# Patient Record
Sex: Male | Born: 2005 | Race: Black or African American | Hispanic: No | Marital: Single | Smoking: Never smoker
Health system: Southern US, Community
[De-identification: ages and names within clinical notes are randomized; demographics above are authoritative.]

---
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
## 2023-01-08 ENCOUNTER — Ambulatory Visit (HOSPITAL_COMMUNITY)
Admission: EM | Admit: 2023-01-08 | Discharge: 2023-01-08 | Disposition: A | Discharge status: Home / Self Care | Payer: Medicaid Other | Attending: Emergency Medicine | Admitting: Emergency Medicine

## 2023-01-08 ENCOUNTER — Encounter (HOSPITAL_COMMUNITY): Payer: Self-pay | Admitting: *Deleted

## 2023-01-08 ENCOUNTER — Ambulatory Visit (HOSPITAL_COMMUNITY): Payer: Self-pay

## 2023-01-08 DIAGNOSIS — S29011A Strain of muscle and tendon of front wall of thorax, initial encounter: Secondary | ICD-10-CM

## 2023-01-08 MED ORDER — IBUPROFEN 800 MG PO TABS: 800.0000 mg | ORAL_TABLET | Freq: Three times a day (TID) | ORAL | 0 refills | Status: AC | PRN

## 2023-01-08 MED ORDER — IBUPROFEN 800 MG PO TABS
800.0000 mg | ORAL_TABLET | Freq: Once | ORAL | Status: AC
  Administered 2023-01-08: 800 mg via ORAL

## 2023-01-08 MED ORDER — IBUPROFEN 800 MG PO TABS
ORAL_TABLET | ORAL | Status: AC
  Filled 2023-01-08: qty 1

## 2023-01-08 NOTE — Discharge Instructions (Addendum)
Your symptoms are consistent with a strain of one of your intercostal muscles.  Please rest, you can ice or heat the area, and take the scheduled anti-inflammatory, ibuprofen.  If your symptoms persist over the next week, please follow-up with your primary care who can refer you out to sports medicine as needed.  Please ensure you are using proper lifting posture to help prevent against muscle strains in the future.   Return to clinic for any new or urgent symptoms.

## 2023-01-08 NOTE — ED Triage Notes (Signed)
Pt states he was lifting and moving furniture last night when he started feeling left lateral ribcage pain. Area non-tender to palpation, but c/o pain with deep breathing, occasionally aggravated by movements.  Has not taken any measures to help alleviate pain.

## 2023-01-08 NOTE — ED Provider Notes (Signed)
[facility]    CSN: [id number] Arrival date & time: 01/08/23  4098      History   Chief Complaint Chief Complaint  Patient presents with   Ribcage Pain    HPI Jerry Marshall is a 17 y.o. male.   Patient presents to clinic with left sided rib cage pain that he noticed last night when he was trying to go to sleep. He was moving heavy furniture a few days ago and thinks he injured himself then. He denies any falls or traumas. He has not taken anything for his pain.   He denies any shortness of breath or wheezing.  He does have musculoskeletal pain with deep breathing and with bending or twisting.  No pain at rest.    The history is provided by the patient and medical records.    History reviewed. No pertinent past medical history.  There are no problems to display for this patient.   History reviewed. No pertinent surgical history.     Home Medications    Prior to Admission medications   Medication Sig Start Date End Date Taking? Authorizing Provider  ibuprofen (ADVIL) 800 MG tablet Take 1 tablet (800 mg total) by mouth every 8 (eight) hours as needed for up to 7 days. 01/08/23 01/15/23 Yes Maitri Schnoebelen, [place] N, FNP  fluticasone [facility]) 50 MCG/ACT nasal spray 1 spray by Each Nare route daily. 05/03/16   [provider]  mupirocin cream (BACTROBAN) 2 % Apply 1 application topically 2 (two) times daily. 03/21/19   Myra Rude, MD  ondansetron (ZOFRAN ODT) 4 MG disintegrating tablet Take 1 tablet (4 mg total) by mouth every 8 (eight) hours as needed for nausea or vomiting. 04/05/20   Particia Nearing, PA-C    Family History Family History  Problem Relation Age of Onset   Healthy Mother    Healthy Father     Social History Social History   Tobacco Use   Smoking status: Never   Smokeless tobacco: Never  Vaping Use   Vaping Use: Never used  Substance Use Topics   Alcohol use: No   Drug use: Yes    Types: Marijuana     Allergies    Pollen extract   Review of Systems Review of Systems  Constitutional:  Negative for fever.  Respiratory:  Negative for shortness of breath and wheezing.   Cardiovascular:  Negative for chest pain.     Physical Exam Triage Vital Signs ED Triage Vitals  Enc Vitals Group     BP 01/08/23 1049 115/64     Pulse Rate 01/08/23 1049 93     Resp 01/08/23 1049 18     Temp 01/08/23 1049 99.3 F (37.4 C)     Temp Source 01/08/23 1049 Oral     SpO2 01/08/23 1049 95 %     Weight --      Height --      Head Circumference --      Peak Flow --      Pain Score 01/08/23 1050 0     Pain Loc --      Pain Edu? --      Excl. in GC? --    No data found.  Updated Vital Signs BP 115/64   Pulse 93   Temp 99.3 F (37.4 C) (Oral)   Resp 18   SpO2 95%   Visual Acuity Right Eye Distance:   Left Eye Distance:   Bilateral Distance:    Right Eye  Near:   Left Eye Near:    Bilateral Near:     Physical Exam Vitals and nursing note reviewed.  Constitutional:      Appearance: Normal appearance.  HENT:     Head: Normocephalic and atraumatic.     Right Ear: External ear normal.     Left Ear: External ear normal.     Nose: Nose normal.     Mouth/Throat:     Mouth: Mucous membranes are moist.  Eyes:     Conjunctiva/sclera: Conjunctivae normal.  Cardiovascular:     Rate and Rhythm: Normal rate and regular rhythm.     Heart sounds: Normal heart sounds, S1 normal and S2 normal. No murmur heard. Pulmonary:     Effort: Pulmonary effort is normal. No respiratory distress.     Breath sounds: Normal breath sounds.  Chest:     Chest wall: No mass, lacerations, deformity, swelling or tenderness.       Comments: Left-sided intercostal musculoskeletal tenderness with stretching the muscle and taking a deep breath.  No tenderness to palpation, without crepitus, swelling, deformity or bruising. Musculoskeletal:        General: No swelling, tenderness, deformity or signs of injury. Normal range of  motion.  Skin:    General: Skin is warm and dry.  Neurological:     General: No focal deficit present.     Mental Status: He is alert.  Psychiatric:        Mood and Affect: Mood normal.        Behavior: Behavior is cooperative.      UC Treatments / Results  Labs (all labs ordered are listed, but only abnormal results are displayed) Labs Reviewed - No data to display  EKG   Radiology No results found.  Procedures Procedures (including critical care time)  Medications Ordered in UC Medications  ibuprofen (ADVIL) tablet 800 mg (800 mg Oral Given 01/08/23 1113)    Initial Impression / Assessment and Plan / UC Course  I have reviewed the triage vital signs and the nursing notes.  Pertinent labs & imaging results that were available during my care of the patient were reviewed by me and considered in my medical decision making (see chart for details).  Vitals and triage reviewed, patient is hemodynamically stable.  Lungs vesicular posteriorly.  Without tenderness to palpation, crepitus, swelling, ecchymosis or deformity of the area.  Atraumatic, no falls.  Physical exam consistent with intercostal muscle strain.  Discussed treatment with anti-inflammatories, rest, heat and ice.  Return and follow-up precautions given, no questions at this time.    Final Clinical Impressions(s) / UC Diagnoses   Final diagnoses:  Intercostal muscle strain, initial encounter     Discharge Instructions      Your symptoms are consistent with a strain of one of your intercostal muscles.  Please rest, you can ice or heat the area, and take the scheduled anti-inflammatory, ibuprofen.  If your symptoms persist over the next week, please follow-up with your primary care who can refer you out to sports medicine as needed.  Please ensure you are using proper lifting posture to help prevent against muscle strains in the future.   Return to clinic for any new or urgent symptoms.      ED  Prescriptions     Medication Sig Dispense Auth. Provider   ibuprofen (ADVIL) 800 MG tablet Take 1 tablet (800 mg total) by mouth every 8 (eight) hours as needed for up to 7 days. 21 tablet Rinaldo Ratel,  [place] N, FNP      PDMP not reviewed this encounter.   Aylah Yeary, [place] N, [place] 01/08/23 1115

## 2023-05-16 DIAGNOSIS — Z419 Encounter for procedure for purposes other than remedying health state, unspecified: Secondary | ICD-10-CM | POA: Diagnosis not present

## 2023-06-15 DIAGNOSIS — Z419 Encounter for procedure for purposes other than remedying health state, unspecified: Secondary | ICD-10-CM | POA: Diagnosis not present

## 2023-06-27 ENCOUNTER — Ambulatory Visit
Admission: EM | Admit: 2023-06-27 | Discharge: 2023-06-27 | Disposition: A | Discharge status: Home / Self Care | Payer: Medicaid Other | Attending: Internal Medicine | Admitting: Internal Medicine

## 2023-06-27 DIAGNOSIS — J069 Acute upper respiratory infection, unspecified: Secondary | ICD-10-CM

## 2023-06-27 MED ORDER — BENZONATATE 200 MG PO CAPS: 200.0000 mg | ORAL_CAPSULE | Freq: Three times a day (TID) | ORAL | 0 refills | Status: AC | PRN

## 2023-06-27 NOTE — Discharge Instructions (Signed)
You may take Tessalon as needed for cough.  Lots of rest and fluids.  Please follow-up with your PCP if your symptoms do not improve.  Please go to the ER for any worsening symptoms.  I hope you feel better soon!

## 2023-06-27 NOTE — ED Triage Notes (Signed)
Pt reports sore throat and nasal congestion starting yesterday.  Took Robitussin and nasal spray with minimal relief.

## 2023-06-27 NOTE — ED Provider Notes (Signed)
[facility]    CSN: [id number] Arrival date & time: 06/27/23  0848      History   Chief Complaint Chief Complaint  Patient presents with   Sore Throat   Nasal Congestion    HPI Jerry Marshall is a 17 y.o. male  presents for evaluation of URI symptoms for 1 days. Patient reports associated symptoms of sore throat, congestion, mild cough. Denies N/V/D, fevers, ear pain, body aches, shortness of breath. Patient does not have a hx of asthma. Patient does not have a history of smoking.  Reports girlfriend has similar symptoms.  Pt has taken Robitussin and nasal spray OTC for symptoms. Pt has no other concerns at this time.    Sore Throat    History reviewed. No pertinent past medical history.  There are no active problems to display for this patient.   History reviewed. No pertinent surgical history.     Home Medications    Prior to Admission medications   Medication Sig Start Date End Date Taking? Authorizing Provider  benzonatate (TESSALON) 200 MG capsule Take 1 capsule (200 mg total) by mouth 3 (three) times daily as needed. 06/27/23  Yes Radford Pax, NP  fluticasone (FLONASE) 50 MCG/ACT nasal spray 1 spray by Each Nare route daily. 05/03/16   [provider]  mupirocin cream (BACTROBAN) 2 % Apply 1 application topically 2 (two) times daily. 03/21/19   Myra Rude, MD  ondansetron (ZOFRAN ODT) 4 MG disintegrating tablet Take 1 tablet (4 mg total) by mouth every 8 (eight) hours as needed for nausea or vomiting. 04/05/20   Particia Nearing, PA-C    Family History Family History  Problem Relation Age of Onset   Healthy Mother    Healthy Father     Social History Social History   Tobacco Use   Smoking status: Never   Smokeless tobacco: Never  Vaping Use   Vaping status: Never Used  Substance Use Topics   Alcohol use: No   Drug use: Not Currently    Types: Marijuana     Allergies   Pollen extract   Review of  Systems Review of Systems  HENT:  Positive for congestion and sore throat.   Respiratory:  Positive for cough.      Physical Exam Triage Vital Signs ED Triage Vitals  Encounter Vitals Group     BP 06/27/23 0921 107/69     Systolic BP Percentile --      Diastolic BP Percentile --      Pulse Rate 06/27/23 0921 79     Resp 06/27/23 0921 16     Temp 06/27/23 0921 (!) 97.3 F (36.3 C)     Temp Source 06/27/23 0921 Oral     SpO2 06/27/23 0921 97 %     Weight --      Height --      Head Circumference --      Peak Flow --      Pain Score 06/27/23 0917 1     Pain Loc --      Pain Education --      Exclude from Growth Chart --    No data found.  Updated Vital Signs BP 107/69 (BP Location: Left Arm)   Pulse 79   Temp (!) 97.3 F (36.3 C) (Oral) Comment: has a drink that is "a little" cold. no fevers at home.  Resp 16   SpO2 97%   Visual Acuity Right Eye Distance:   Left  Eye Distance:   Bilateral Distance:    Right Eye Near:   Left Eye Near:    Bilateral Near:     Physical Exam Vitals and nursing note reviewed.  Constitutional:      General: He is not in acute distress.    Appearance: Normal appearance. He is not ill-appearing or toxic-appearing.  HENT:     Head: Normocephalic and atraumatic.     Right Ear: Tympanic membrane and ear canal normal.     Left Ear: Tympanic membrane and ear canal normal.     Nose: Congestion present.     Mouth/Throat:     Mouth: Mucous membranes are moist.     Pharynx: Posterior oropharyngeal erythema present.  Eyes:     Pupils: Pupils are equal, round, and reactive to light.  Cardiovascular:     Rate and Rhythm: Normal rate and regular rhythm.     Heart sounds: Normal heart sounds.  Pulmonary:     Effort: Pulmonary effort is normal.     Breath sounds: Normal breath sounds.  Musculoskeletal:     Cervical back: Normal range of motion and neck supple.  Lymphadenopathy:     Cervical: No cervical adenopathy.  Skin:    General:  Skin is warm and dry.  Neurological:     General: No focal deficit present.     Mental Status: He is alert and oriented to person, place, and time.  Psychiatric:        Mood and Affect: Mood normal.        Behavior: Behavior normal.      UC Treatments / Results  Labs (all labs ordered are listed, but only abnormal results are displayed) Labs Reviewed - No data to display  EKG   Radiology No results found.  Procedures Procedures (including critical care time)  Medications Ordered in UC Medications - No data to display  Initial Impression / Assessment and Plan / UC Course  I have reviewed the triage vital signs and the nursing notes.  Pertinent labs & imaging results that were available during my care of the patient were reviewed by me and considered in my medical decision making (see chart for details).     Reviewed exam and symptoms with patient.  No red flags.  He declined strep, flu, COVID testing.  Discussed viral illness and symptomatic treatment.  PCP follow-up if symptoms do not improve.  ER precautions reviewed. Final Clinical Impressions(s) / UC Diagnoses   Final diagnoses:  Viral upper respiratory infection     Discharge Instructions      You may take Tessalon as needed for cough.  Lots of rest and fluids.  Please follow-up with your PCP if your symptoms do not improve.  Please go to the ER for any worsening symptoms.  I hope you feel better soon!    ED Prescriptions     Medication Sig Dispense Auth. Provider   benzonatate (TESSALON) 200 MG capsule Take 1 capsule (200 mg total) by mouth 3 (three) times daily as needed. 20 capsule Radford Pax, NP      PDMP not reviewed this encounter.   Radford Pax, NP 06/27/23 [phone]

## 2023-07-16 DIAGNOSIS — Z419 Encounter for procedure for purposes other than remedying health state, unspecified: Secondary | ICD-10-CM | POA: Diagnosis not present

## 2023-08-16 DIAGNOSIS — Z419 Encounter for procedure for purposes other than remedying health state, unspecified: Secondary | ICD-10-CM | POA: Diagnosis not present

## 2023-09-13 DIAGNOSIS — Z419 Encounter for procedure for purposes other than remedying health state, unspecified: Secondary | ICD-10-CM | POA: Diagnosis not present

## 2023-09-24 DIAGNOSIS — Z419 Encounter for procedure for purposes other than remedying health state, unspecified: Secondary | ICD-10-CM | POA: Diagnosis not present

## 2023-10-25 DIAGNOSIS — Z419 Encounter for procedure for purposes other than remedying health state, unspecified: Secondary | ICD-10-CM | POA: Diagnosis not present

## 2023-11-24 DIAGNOSIS — Z419 Encounter for procedure for purposes other than remedying health state, unspecified: Secondary | ICD-10-CM | POA: Diagnosis not present

## 2023-12-25 DIAGNOSIS — Z419 Encounter for procedure for purposes other than remedying health state, unspecified: Secondary | ICD-10-CM | POA: Diagnosis not present

## 2024-04-25 DIAGNOSIS — Z419 Encounter for procedure for purposes other than remedying health state, unspecified: Secondary | ICD-10-CM | POA: Diagnosis not present

## 2024-06-25 DIAGNOSIS — Z419 Encounter for procedure for purposes other than remedying health state, unspecified: Secondary | ICD-10-CM | POA: Diagnosis not present
